# Patient Record
Sex: Female | Born: 1965 | Race: Black or African American | Hispanic: No | Marital: Single | Smoking: Current every day smoker
Health system: Southern US, Community
[De-identification: ages and names within clinical notes are randomized; demographics above are authoritative.]

## PROBLEM LIST (undated history)

## (undated) DIAGNOSIS — N200 Calculus of kidney: Secondary | ICD-10-CM

## (undated) HISTORY — PX: BACK SURGERY: SHX140

## (undated) HISTORY — PX: CHOLECYSTECTOMY: SHX55

---

## 2016-10-29 ENCOUNTER — Emergency Department: Payer: No Typology Code available for payment source

## 2016-10-29 ENCOUNTER — Encounter: Payer: Self-pay | Admitting: Emergency Medicine

## 2016-10-29 ENCOUNTER — Emergency Department
Admission: EM | Admit: 2016-10-29 | Discharge: 2016-10-29 | Disposition: A | Discharge status: Home / Self Care | Payer: No Typology Code available for payment source | Attending: Emergency Medicine | Admitting: Emergency Medicine

## 2016-10-29 DIAGNOSIS — M25511 Pain in right shoulder: Secondary | ICD-10-CM | POA: Diagnosis not present

## 2016-10-29 DIAGNOSIS — M25561 Pain in right knee: Secondary | ICD-10-CM | POA: Insufficient documentation

## 2016-10-29 DIAGNOSIS — R51 Headache: Secondary | ICD-10-CM | POA: Diagnosis not present

## 2016-10-29 DIAGNOSIS — Y92410 Unspecified street and highway as the place of occurrence of the external cause: Secondary | ICD-10-CM | POA: Insufficient documentation

## 2016-10-29 DIAGNOSIS — M545 Low back pain: Secondary | ICD-10-CM | POA: Insufficient documentation

## 2016-10-29 DIAGNOSIS — Y999 Unspecified external cause status: Secondary | ICD-10-CM | POA: Insufficient documentation

## 2016-10-29 DIAGNOSIS — F17200 Nicotine dependence, unspecified, uncomplicated: Secondary | ICD-10-CM | POA: Insufficient documentation

## 2016-10-29 DIAGNOSIS — S0990XA Unspecified injury of head, initial encounter: Secondary | ICD-10-CM | POA: Diagnosis present

## 2016-10-29 DIAGNOSIS — Y939 Activity, unspecified: Secondary | ICD-10-CM | POA: Insufficient documentation

## 2016-10-29 DIAGNOSIS — M542 Cervicalgia: Secondary | ICD-10-CM | POA: Diagnosis not present

## 2016-10-29 HISTORY — DX: Calculus of kidney: N20.0

## 2016-10-29 LAB — COMPREHENSIVE METABOLIC PANEL
ALT: 10 U/L — AB (ref 14–54)
ANION GAP: 9 (ref 5–15)
AST: 25 U/L (ref 15–41)
Albumin: 3.6 g/dL (ref 3.5–5.0)
Alkaline Phosphatase: 62 U/L (ref 38–126)
BUN: 10 mg/dL (ref 6–20)
CHLORIDE: 110 mmol/L (ref 101–111)
CO2: 22 mmol/L (ref 22–32)
CREATININE: 0.73 mg/dL (ref 0.44–1.00)
Calcium: 8.9 mg/dL (ref 8.9–10.3)
GFR calc non Af Amer: >60 mL/min (ref >60)
Glucose, Bld: 90 mg/dL (ref 65–99)
POTASSIUM: 4.3 mmol/L (ref 3.5–5.1)
SODIUM: 141 mmol/L (ref 135–145)
Total Bilirubin: 0.4 mg/dL (ref 0.3–1.2)
Total Protein: 7.1 g/dL (ref 6.5–8.1)

## 2016-10-29 LAB — CBC WITH DIFFERENTIAL/PLATELET
Basophils Absolute: 0 10*3/uL (ref 0–0.1)
Basophils Relative: 1 %
EOS ABS: 0 10*3/uL (ref 0–0.7)
EOS PCT: 0 %
HCT: 41.7 % (ref 35.0–47.0)
Hemoglobin: 13.8 g/dL (ref 12.0–16.0)
LYMPHS ABS: 2 10*3/uL (ref 1.0–3.6)
Lymphocytes Relative: 38 %
MCH: 30.4 pg (ref 26.0–34.0)
MCHC: 33 g/dL (ref 32.0–36.0)
MCV: 92.3 fL (ref 80.0–100.0)
Monocytes Absolute: 0.5 10*3/uL (ref 0.2–0.9)
Monocytes Relative: 10 %
Neutro Abs: 2.7 10*3/uL (ref 1.4–6.5)
Neutrophils Relative %: 51 %
PLATELETS: 279 10*3/uL (ref 150–440)
RBC: 4.52 MIL/uL (ref 3.80–5.20)
RDW: 13.7 % (ref 11.5–14.5)
WBC: 5.3 10*3/uL (ref 3.6–11.0)

## 2016-10-29 LAB — TROPONIN I: Troponin I: <0.03 ng/mL (ref <0.03)

## 2016-10-29 MED ORDER — OXYCODONE-ACETAMINOPHEN 5-325 MG PO TABS: 1.0000 | ORAL_TABLET | Freq: Four times a day (QID) | ORAL | 0 refills | Status: AC | PRN

## 2016-10-29 MED ORDER — CYCLOBENZAPRINE HCL 5 MG PO TABS: 5.0000 mg | ORAL_TABLET | Freq: Three times a day (TID) | ORAL | 0 refills | Status: AC | PRN

## 2016-10-29 MED ORDER — IBUPROFEN 800 MG PO TABS: 800.0000 mg | ORAL_TABLET | Freq: Three times a day (TID) | ORAL | 0 refills | Status: AC | PRN

## 2016-10-29 MED ORDER — PROMETHAZINE HCL 25 MG/ML IJ SOLN
25.0000 mg | Freq: Once | INTRAMUSCULAR | Status: AC
  Administered 2016-10-29: 25 mg via INTRAVENOUS
  Filled 2016-10-29: qty 1

## 2016-10-29 MED ORDER — MORPHINE SULFATE (PF) 4 MG/ML IV SOLN
4.0000 mg | Freq: Once | INTRAVENOUS | Status: AC
  Administered 2016-10-29: 4 mg via INTRAVENOUS
  Filled 2016-10-29: qty 1

## 2016-10-29 MED ORDER — SODIUM CHLORIDE 0.9 % IV BOLUS (SEPSIS)
1000.0000 mL | Freq: Once | INTRAVENOUS | Status: AC
  Administered 2016-10-29: 1000 mL via INTRAVENOUS

## 2016-10-29 NOTE — ED Notes (Signed)
Computer in room frozen trying to open e sign. Cannot restart computer. Portable computer will nto let E sign since frozen in that screen on another computer. Pt verbalized understanding of discharge.
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
## 2016-10-29 NOTE — ED Notes (Signed)
Just returned from x-ray.
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
## 2016-10-29 NOTE — Discharge Instructions (Signed)
Take motrin for pain.   Take flexeril for muscle spasms.   Rest for 2 days,.   Take percocet only for severe pain. DO NOT drive with it.   See your doctor  Return to ER if you have severe pain, unable to walk, confusion, chest pain, abdominal pain, vomiting.

## 2016-10-29 NOTE — ED Notes (Signed)
Returned from CT , waiting for xray

## 2016-10-29 NOTE — ED Notes (Signed)
No needs. Lights dimmed for comfort.

## 2017-05-16 ENCOUNTER — Emergency Department: Payer: Medicaid Other

## 2017-05-16 ENCOUNTER — Emergency Department
Admission: EM | Admit: 2017-05-16 | Discharge: 2017-05-16 | Disposition: A | Discharge status: Home / Self Care | Payer: Medicaid Other | Attending: Emergency Medicine | Admitting: Emergency Medicine

## 2017-05-16 ENCOUNTER — Encounter: Payer: Self-pay | Admitting: Emergency Medicine

## 2017-05-16 DIAGNOSIS — F17200 Nicotine dependence, unspecified, uncomplicated: Secondary | ICD-10-CM | POA: Insufficient documentation

## 2017-05-16 DIAGNOSIS — M791 Myalgia: Secondary | ICD-10-CM | POA: Insufficient documentation

## 2017-05-16 DIAGNOSIS — M7918 Myalgia, other site: Secondary | ICD-10-CM

## 2017-05-16 DIAGNOSIS — M545 Low back pain: Secondary | ICD-10-CM | POA: Diagnosis present

## 2017-05-16 MED ORDER — IBUPROFEN 600 MG PO TABS: 600.0000 mg | ORAL_TABLET | Freq: Three times a day (TID) | ORAL | 0 refills | Status: AC | PRN

## 2017-05-16 MED ORDER — CYCLOBENZAPRINE HCL 10 MG PO TABS: 10.0000 mg | ORAL_TABLET | Freq: Three times a day (TID) | ORAL | 0 refills | Status: AC | PRN

## 2017-05-16 MED ORDER — OXYCODONE-ACETAMINOPHEN 5-325 MG PO TABS
1.0000 | ORAL_TABLET | Freq: Once | ORAL | Status: AC
  Administered 2017-05-16: 1 via ORAL
  Filled 2017-05-16: qty 1

## 2017-05-16 MED ORDER — OXYCODONE-ACETAMINOPHEN 5-325 MG PO TABS: 1.0000 | ORAL_TABLET | Freq: Four times a day (QID) | ORAL | 0 refills | Status: AC | PRN

## 2017-05-16 MED ORDER — CYCLOBENZAPRINE HCL 10 MG PO TABS
10.0000 mg | ORAL_TABLET | Freq: Once | ORAL | Status: AC
  Administered 2017-05-16: 10 mg via ORAL
  Filled 2017-05-16: qty 1

## 2017-05-16 MED ORDER — IBUPROFEN 600 MG PO TABS
600.0000 mg | ORAL_TABLET | Freq: Once | ORAL | Status: AC
  Administered 2017-05-16: 600 mg via ORAL
  Filled 2017-05-16: qty 1
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
## 2021-05-10 DIAGNOSIS — Z23 Encounter for immunization: Secondary | ICD-10-CM | POA: Diagnosis not present

## 2021-11-21 DIAGNOSIS — Z79899 Other long term (current) drug therapy: Secondary | ICD-10-CM | POA: Diagnosis not present

## 2021-11-21 DIAGNOSIS — M7061 Trochanteric bursitis, right hip: Secondary | ICD-10-CM | POA: Diagnosis not present

## 2021-11-21 DIAGNOSIS — I10 Essential (primary) hypertension: Secondary | ICD-10-CM | POA: Diagnosis not present

## 2021-11-21 DIAGNOSIS — Z23 Encounter for immunization: Secondary | ICD-10-CM | POA: Diagnosis not present

## 2021-11-21 DIAGNOSIS — E785 Hyperlipidemia, unspecified: Secondary | ICD-10-CM | POA: Diagnosis not present

## 2021-11-21 DIAGNOSIS — Z8249 Family history of ischemic heart disease and other diseases of the circulatory system: Secondary | ICD-10-CM | POA: Diagnosis not present

## 2021-11-21 DIAGNOSIS — Z7182 Exercise counseling: Secondary | ICD-10-CM | POA: Diagnosis not present

## 2021-11-21 DIAGNOSIS — M7062 Trochanteric bursitis, left hip: Secondary | ICD-10-CM | POA: Diagnosis not present

## 2021-11-21 DIAGNOSIS — Z9049 Acquired absence of other specified parts of digestive tract: Secondary | ICD-10-CM | POA: Diagnosis not present

## 2021-11-21 DIAGNOSIS — Z0000 Encounter for general adult medical examination without abnormal findings: Secondary | ICD-10-CM | POA: Diagnosis not present

## 2021-11-21 DIAGNOSIS — F419 Anxiety disorder, unspecified: Secondary | ICD-10-CM | POA: Diagnosis not present

## 2021-11-21 DIAGNOSIS — F17210 Nicotine dependence, cigarettes, uncomplicated: Secondary | ICD-10-CM | POA: Diagnosis not present

## 2021-11-21 DIAGNOSIS — Z87442 Personal history of urinary calculi: Secondary | ICD-10-CM | POA: Diagnosis not present

## 2021-11-21 DIAGNOSIS — R109 Unspecified abdominal pain: Secondary | ICD-10-CM | POA: Diagnosis not present

## 2022-06-03 DIAGNOSIS — I10 Essential (primary) hypertension: Secondary | ICD-10-CM | POA: Diagnosis not present

## 2022-06-03 DIAGNOSIS — Z23 Encounter for immunization: Secondary | ICD-10-CM | POA: Diagnosis not present

## 2022-06-03 DIAGNOSIS — F419 Anxiety disorder, unspecified: Secondary | ICD-10-CM | POA: Diagnosis not present

## 2022-06-03 DIAGNOSIS — R110 Nausea: Secondary | ICD-10-CM | POA: Diagnosis not present

## 2022-06-28 DIAGNOSIS — F419 Anxiety disorder, unspecified: Secondary | ICD-10-CM | POA: Diagnosis not present

## 2022-06-28 DIAGNOSIS — L723 Sebaceous cyst: Secondary | ICD-10-CM | POA: Diagnosis not present

## 2022-06-28 DIAGNOSIS — L089 Local infection of the skin and subcutaneous tissue, unspecified: Secondary | ICD-10-CM | POA: Diagnosis not present

## 2022-12-06 DIAGNOSIS — Z79899 Other long term (current) drug therapy: Secondary | ICD-10-CM | POA: Diagnosis not present

## 2022-12-06 DIAGNOSIS — E785 Hyperlipidemia, unspecified: Secondary | ICD-10-CM | POA: Diagnosis not present

## 2022-12-06 DIAGNOSIS — F17210 Nicotine dependence, cigarettes, uncomplicated: Secondary | ICD-10-CM | POA: Diagnosis not present

## 2022-12-06 DIAGNOSIS — Z0001 Encounter for general adult medical examination with abnormal findings: Secondary | ICD-10-CM | POA: Diagnosis not present

## 2022-12-06 DIAGNOSIS — Z5941 Food insecurity: Secondary | ICD-10-CM | POA: Diagnosis not present

## 2022-12-06 DIAGNOSIS — Z0000 Encounter for general adult medical examination without abnormal findings: Secondary | ICD-10-CM | POA: Diagnosis not present

## 2022-12-06 DIAGNOSIS — F419 Anxiety disorder, unspecified: Secondary | ICD-10-CM | POA: Diagnosis not present

## 2022-12-06 DIAGNOSIS — Z131 Encounter for screening for diabetes mellitus: Secondary | ICD-10-CM | POA: Diagnosis not present

## 2022-12-06 DIAGNOSIS — R109 Unspecified abdominal pain: Secondary | ICD-10-CM | POA: Diagnosis not present

## 2022-12-06 DIAGNOSIS — Z23 Encounter for immunization: Secondary | ICD-10-CM | POA: Diagnosis not present

## 2022-12-06 DIAGNOSIS — Z1231 Encounter for screening mammogram for malignant neoplasm of breast: Secondary | ICD-10-CM | POA: Diagnosis not present

## 2022-12-06 DIAGNOSIS — I10 Essential (primary) hypertension: Secondary | ICD-10-CM | POA: Diagnosis not present

## 2023-03-26 DIAGNOSIS — Z1231 Encounter for screening mammogram for malignant neoplasm of breast: Secondary | ICD-10-CM | POA: Diagnosis not present

## 2023-05-07 DIAGNOSIS — R928 Other abnormal and inconclusive findings on diagnostic imaging of breast: Secondary | ICD-10-CM | POA: Diagnosis not present

## 2023-06-06 DIAGNOSIS — I10 Essential (primary) hypertension: Secondary | ICD-10-CM | POA: Diagnosis not present

## 2023-06-06 DIAGNOSIS — M7062 Trochanteric bursitis, left hip: Secondary | ICD-10-CM | POA: Diagnosis not present

## 2023-06-06 DIAGNOSIS — E785 Hyperlipidemia, unspecified: Secondary | ICD-10-CM | POA: Diagnosis not present

## 2023-06-06 DIAGNOSIS — F419 Anxiety disorder, unspecified: Secondary | ICD-10-CM | POA: Diagnosis not present

## 2023-06-06 DIAGNOSIS — M25552 Pain in left hip: Secondary | ICD-10-CM | POA: Diagnosis not present

## 2023-06-06 DIAGNOSIS — M7061 Trochanteric bursitis, right hip: Secondary | ICD-10-CM | POA: Diagnosis not present

## 2023-06-06 DIAGNOSIS — M25551 Pain in right hip: Secondary | ICD-10-CM | POA: Diagnosis not present

## 2023-06-06 DIAGNOSIS — Z8719 Personal history of other diseases of the digestive system: Secondary | ICD-10-CM | POA: Diagnosis not present

## 2023-06-06 DIAGNOSIS — Z23 Encounter for immunization: Secondary | ICD-10-CM | POA: Diagnosis not present

## 2023-12-08 DIAGNOSIS — M7062 Trochanteric bursitis, left hip: Secondary | ICD-10-CM | POA: Diagnosis not present

## 2023-12-08 DIAGNOSIS — Z748 Other problems related to care provider dependency: Secondary | ICD-10-CM | POA: Diagnosis not present

## 2023-12-08 DIAGNOSIS — Z0000 Encounter for general adult medical examination without abnormal findings: Secondary | ICD-10-CM | POA: Diagnosis not present

## 2023-12-08 DIAGNOSIS — R109 Unspecified abdominal pain: Secondary | ICD-10-CM | POA: Diagnosis not present

## 2023-12-08 DIAGNOSIS — Z79899 Other long term (current) drug therapy: Secondary | ICD-10-CM | POA: Diagnosis not present

## 2023-12-08 DIAGNOSIS — F419 Anxiety disorder, unspecified: Secondary | ICD-10-CM | POA: Diagnosis not present

## 2023-12-08 DIAGNOSIS — M7061 Trochanteric bursitis, right hip: Secondary | ICD-10-CM | POA: Diagnosis not present

## 2023-12-08 DIAGNOSIS — Z23 Encounter for immunization: Secondary | ICD-10-CM | POA: Diagnosis not present

## 2023-12-08 DIAGNOSIS — R7303 Prediabetes: Secondary | ICD-10-CM | POA: Diagnosis not present

## 2023-12-08 DIAGNOSIS — I10 Essential (primary) hypertension: Secondary | ICD-10-CM | POA: Diagnosis not present

## 2023-12-08 DIAGNOSIS — Z1331 Encounter for screening for depression: Secondary | ICD-10-CM | POA: Diagnosis not present

## 2023-12-08 DIAGNOSIS — Z1330 Encounter for screening examination for mental health and behavioral disorders, unspecified: Secondary | ICD-10-CM | POA: Diagnosis not present

## 2023-12-08 DIAGNOSIS — Z0001 Encounter for general adult medical examination with abnormal findings: Secondary | ICD-10-CM | POA: Diagnosis not present

## 2023-12-08 DIAGNOSIS — F17210 Nicotine dependence, cigarettes, uncomplicated: Secondary | ICD-10-CM | POA: Diagnosis not present

## 2023-12-08 DIAGNOSIS — L304 Erythema intertrigo: Secondary | ICD-10-CM | POA: Diagnosis not present

## 2023-12-08 DIAGNOSIS — Z5941 Food insecurity: Secondary | ICD-10-CM | POA: Diagnosis not present

## 2024-01-21 DIAGNOSIS — H5203 Hypermetropia, bilateral: Secondary | ICD-10-CM | POA: Diagnosis not present

## 2024-01-23 DIAGNOSIS — H5213 Myopia, bilateral: Secondary | ICD-10-CM | POA: Diagnosis not present

## 2024-04-25 DIAGNOSIS — K5900 Constipation, unspecified: Secondary | ICD-10-CM | POA: Diagnosis not present

## 2024-04-25 DIAGNOSIS — I10 Essential (primary) hypertension: Secondary | ICD-10-CM | POA: Diagnosis not present

## 2024-04-25 DIAGNOSIS — K219 Gastro-esophageal reflux disease without esophagitis: Secondary | ICD-10-CM | POA: Diagnosis not present

## 2024-04-25 DIAGNOSIS — Z885 Allergy status to narcotic agent status: Secondary | ICD-10-CM | POA: Diagnosis not present

## 2024-04-25 DIAGNOSIS — R109 Unspecified abdominal pain: Secondary | ICD-10-CM | POA: Diagnosis not present

## 2024-04-25 DIAGNOSIS — F17210 Nicotine dependence, cigarettes, uncomplicated: Secondary | ICD-10-CM | POA: Diagnosis not present

## 2024-04-25 DIAGNOSIS — M1990 Unspecified osteoarthritis, unspecified site: Secondary | ICD-10-CM | POA: Diagnosis not present

## 2024-04-26 DIAGNOSIS — M7061 Trochanteric bursitis, right hip: Secondary | ICD-10-CM | POA: Diagnosis not present

## 2024-04-30 DIAGNOSIS — R110 Nausea: Secondary | ICD-10-CM | POA: Diagnosis not present

## 2024-04-30 DIAGNOSIS — R1032 Left lower quadrant pain: Secondary | ICD-10-CM | POA: Diagnosis not present

## 2024-04-30 DIAGNOSIS — R109 Unspecified abdominal pain: Secondary | ICD-10-CM | POA: Diagnosis not present

## 2024-04-30 DIAGNOSIS — M7061 Trochanteric bursitis, right hip: Secondary | ICD-10-CM | POA: Diagnosis not present

## 2024-04-30 DIAGNOSIS — R1031 Right lower quadrant pain: Secondary | ICD-10-CM | POA: Diagnosis not present

## 2024-05-13 DIAGNOSIS — R1032 Left lower quadrant pain: Secondary | ICD-10-CM | POA: Diagnosis not present

## 2024-05-13 DIAGNOSIS — R1031 Right lower quadrant pain: Secondary | ICD-10-CM | POA: Diagnosis not present

## 2024-05-16 DIAGNOSIS — R1031 Right lower quadrant pain: Secondary | ICD-10-CM | POA: Diagnosis not present

## 2024-05-16 DIAGNOSIS — R109 Unspecified abdominal pain: Secondary | ICD-10-CM | POA: Diagnosis not present

## 2024-05-16 DIAGNOSIS — R1084 Generalized abdominal pain: Secondary | ICD-10-CM | POA: Diagnosis not present

## 2024-05-16 DIAGNOSIS — F17210 Nicotine dependence, cigarettes, uncomplicated: Secondary | ICD-10-CM | POA: Diagnosis not present

## 2024-05-16 DIAGNOSIS — R1032 Left lower quadrant pain: Secondary | ICD-10-CM | POA: Diagnosis not present

## 2024-05-16 DIAGNOSIS — R110 Nausea: Secondary | ICD-10-CM | POA: Diagnosis not present

## 2024-05-27 DIAGNOSIS — K582 Mixed irritable bowel syndrome: Secondary | ICD-10-CM | POA: Diagnosis not present

## 2024-07-27 DIAGNOSIS — M7061 Trochanteric bursitis, right hip: Secondary | ICD-10-CM | POA: Diagnosis not present

## 2024-08-07 DIAGNOSIS — R7303 Prediabetes: Secondary | ICD-10-CM | POA: Diagnosis not present

## 2024-08-07 DIAGNOSIS — Z79899 Other long term (current) drug therapy: Secondary | ICD-10-CM | POA: Diagnosis not present

## 2024-08-07 DIAGNOSIS — Z9049 Acquired absence of other specified parts of digestive tract: Secondary | ICD-10-CM | POA: Diagnosis not present

## 2024-08-07 DIAGNOSIS — I10 Essential (primary) hypertension: Secondary | ICD-10-CM | POA: Diagnosis not present

## 2024-08-07 DIAGNOSIS — K219 Gastro-esophageal reflux disease without esophagitis: Secondary | ICD-10-CM | POA: Diagnosis not present

## 2024-08-07 DIAGNOSIS — M1990 Unspecified osteoarthritis, unspecified site: Secondary | ICD-10-CM | POA: Diagnosis not present

## 2024-08-07 DIAGNOSIS — M25551 Pain in right hip: Secondary | ICD-10-CM | POA: Diagnosis not present

## 2024-08-07 DIAGNOSIS — M62830 Muscle spasm of back: Secondary | ICD-10-CM | POA: Diagnosis not present

## 2024-08-07 DIAGNOSIS — R1031 Right lower quadrant pain: Secondary | ICD-10-CM | POA: Diagnosis not present
